# Patient Record
Sex: Female | Born: 1935 | Race: White | Hispanic: No | Marital: Married
Health system: Southern US, Community
[De-identification: ages and names within clinical notes are randomized; demographics above are authoritative.]

---

## 2002-08-22 ENCOUNTER — Encounter: Payer: Self-pay | Admitting: Internal Medicine

## 2002-08-22 ENCOUNTER — Ambulatory Visit (HOSPITAL_COMMUNITY): Admission: RE | Admit: 2002-08-22 | Discharge: 2002-08-22 | Payer: Self-pay | Admitting: Internal Medicine

## 2003-07-13 ENCOUNTER — Ambulatory Visit (HOSPITAL_COMMUNITY): Admission: RE | Admit: 2003-07-13 | Discharge: 2003-07-13 | Payer: Self-pay | Admitting: Internal Medicine

## 2004-08-29 ENCOUNTER — Ambulatory Visit (HOSPITAL_COMMUNITY): Admission: RE | Admit: 2004-08-29 | Discharge: 2004-08-29 | Payer: Self-pay | Admitting: Internal Medicine

## 2005-01-09 ENCOUNTER — Ambulatory Visit (HOSPITAL_COMMUNITY): Admission: RE | Admit: 2005-01-09 | Discharge: 2005-01-09 | Payer: Self-pay | Admitting: Internal Medicine

## 2006-01-11 ENCOUNTER — Ambulatory Visit (HOSPITAL_COMMUNITY): Admission: RE | Admit: 2006-01-11 | Discharge: 2006-01-11 | Payer: Self-pay | Admitting: Internal Medicine

## 2007-07-23 ENCOUNTER — Ambulatory Visit: Payer: Self-pay | Admitting: Internal Medicine

## 2007-08-05 ENCOUNTER — Encounter: Payer: Self-pay | Admitting: Internal Medicine

## 2007-08-05 ENCOUNTER — Ambulatory Visit (HOSPITAL_COMMUNITY): Admission: RE | Admit: 2007-08-05 | Discharge: 2007-08-05 | Payer: Self-pay | Admitting: Internal Medicine

## 2007-08-05 ENCOUNTER — Ambulatory Visit: Payer: Self-pay | Admitting: Internal Medicine

## 2007-09-17 ENCOUNTER — Ambulatory Visit (HOSPITAL_COMMUNITY): Admission: RE | Admit: 2007-09-17 | Discharge: 2007-09-17 | Payer: Self-pay | Admitting: Internal Medicine

## 2010-05-17 NOTE — H&P (Signed)
NAMEDREA, Marie Mccarthy                 ACCOUNT NO.:  [id number]   MEDICAL RECORD NO.:  [id number]          PATIENT TYPE:  AMB   LOCATION:  DAY                           FACILITY:  [facility]   PHYSICIAN:  R. Roetta Sessions, M.D. DATE OF BIRTH:  November 29, 1931   DATE OF ADMISSION:  DATE OF DISCHARGE:  LH                              HISTORY & PHYSICAL   REASON FOR CONSULTATION:  Abdominal pain, weight loss, altered bowel  habits.   HISTORY OF PRESENT ILLNESS:  Marie Mccarthy is a pleasant 77 year old  Caucasian female from [facility], sent over courtesy to Dr. Carylon Perches to further evaluate chronic GI symptoms including alternating  constipation, diarrhea, abdominal discomfort, and fleeting abdominal  knots.   Marie Mccarthy tells me that she worries about everything all the time and  she all of her life has had episodes of diarrhea alternating with  constipation.  Recently, she lost 20 pounds, but has gained some of that  weight back.  She tells me when she gets constipated, she can feel knots  in her abdominal wall, and she is describing the areas of the right  lower quadrant, left lower quadrant, and left upper quadrant.  When she  has a bowel movement, these knots are no longer perceptible.  She has  never passed any blood per rectum.  She does not have any nausea or  vomiting.  No odynophagia, dysphagia, early satiety, or reflux symptoms.  There is no family history of GI neoplasia.  She has no prior history of  gastrointestinal illness.  She has never had a lower GI tract evaluated  via colonoscopy or other modality per her report.   She sleeps poorly at night.  She may have 3-4 loose bowel movements on a  given day and at other times hardly has any bowel movement in a given  day.  She does not take anything specific for bowel function.   MEDICATION:  Her medications include Klonopin and nortriptyline.   PAST MEDICAL HISTORY:  Chronic anxiety neurosis for which she has been  on multiple  medications over time.   PAST SURGERIES:  Appendicitis and tonsillectomy.   CURRENT MEDICATIONS:  Klonopin 0.1 mg one half to one tablet b.i.d.  p.r.n. and nortriptyline 20 mg q.h.s.   ALLERGIES:  LEXAPRO.   FAMILY HISTORY:  Negative for GI or liver illness.  Mother succumbed to  pneumonia at age 52,  father succumbed to heart problems at age 85.  Otherwise, no history of chronic GI or liver illness.   SOCIAL HISTORY:  The patient is married, has two children.  [place]  She does not work.  No tobacco, no  alcohol, and no illicit drugs.   REVIEW OF SYSTEMS:  No chest pain or dyspnea on exertion.  No fever or  chills.   PHYSICAL EXAMINATION:  GENERAL:  Pleasant 77 year old lady, well  orientated, and comfortable.  VITALS SIGNS:  Weight 99, height 5 feet, temperature 97.7, BP 128/78,  and pulse 88.  SKIN:  Warm and dry.  There is no jaundice  or cutaneous stigmata of  chronic liver disease.  HEENT:  No scleral icterus.  Conjunctivae pink.  Oral cavity no lesions.  CHEST:  Lungs are clear to auscultation.  CARDIAC:  Regular rate and rhythm without murmur, gallop, or rub.  ABDOMEN:  Nondistended.  Positive bowel sounds.  No bruits.  Abdomen is  entirely soft, nontender without appreciable mass or hepatosplenomegaly.  EXTREMITIES:  No edema.  RECTAL:  Good sphincter tone.  Soft brown stool in the proximal rectal  vault.  No mass.  Brown stools.  Hemoccult negative.   LABORATORY DATA:  From Dr. Alonza Smoker office on May 10, 2007.  CBC; white  count 7.7, H&H 14.0 and 44.2.  Chem-20 looked entirely normal except for  slightly elevated blood sugar at 107.  Lipid profile; cholesterol 244,  HDL 73, LDL 143, and TSH 1.628.   IMPRESSION:  Marie Mccarthy is a very pleasant 76 year old lady with  alternating constipation and diarrhea along with fleeting abdominal wall  knots referred for further evaluation.   By history, she has had a significant recent fairly  acute drop in weight  from which she has recovered some weight.  I do get a sense that she  does have quite a bit of free-floating anxiety and spends a lot of time  perseverating on her GI symptoms.  I did not appreciate any mass or a  knot on abdominal exam today and it certainly is conceivable in the  setting of constipation and a relatively big body habitus, she may  actually be able to feel loops of colon containing stool as she spends a  lot of time self examining her abdomen.  The fact that these are not  knots diminish with having a bowel movement are reassuring.   This lady has never had a colonoscopy.   RECOMMENDATIONS:  Before we do anything else, I told Ms. Marie Mccarthy that we  would proceed on with a colonoscopy in the near future and then we could  deal with her bowel symptoms further.  The risks, benefits,  alternatives, and limitations of this approach have been reviewed.  Her  questions answered.  She is agreeable.  We will plan for colonoscopy in  the very near future, and we will make further recommendations with this  nice lady's management.      Jonathon Bellows, M.D.  Electronically Signed     RMR/MEDQ  D:  07/23/2007  T:  07/24/2007  Job:  [id number]   cc:   Kingsley Callander. Ouida Sills, MD  Fax: [phone]

## 2010-05-17 NOTE — Op Note (Signed)
NAMEBRYTON, Marie Mccarthy                 ACCOUNT NO.:  [id number]   MEDICAL RECORD NO.:  [id number]          PATIENT TYPE:  AMB   LOCATION:  DAY                           FACILITY:  [facility]   PHYSICIAN:  R. Roetta Sessions, M.D. DATE OF BIRTH:  [birth year]   DATE OF PROCEDURE:  08/05/2007  DATE OF DISCHARGE:                               OPERATIVE REPORT   INDICATIONS FOR PROCEDURE:  A 77 year old lady with intermittent  alternating constipation and diarrhea with the patient on observation,  she can feel knots in her colon when she is constipated, and after she  has evacuated, these abnormalities are no longer perceivable.  She has  never had her lower GI tract evaluated.  There is no family history of  colorectal neoplasia.  Colonoscopy is now being done.  Risks, benefits,  alternatives, and limitations have been reviewed, questions answered.  Please see the documentation in the medical record.   PROCEDURE NOTE:  O2 saturation, blood pressure, pulse, and respirations  were monitored throughout the entire procedure.   CONSCIOUS SEDATION:  Versed and Demerol on incremental doses.   INSTRUMENT:  Pentax video chip system.   FINDINGS:  Digital rectal exam revealed no abnormalities.  Endoscopic  findings:  The prep was adequate.  Colon:  Colonic mucosa was surveyed  from the rectosigmoid junction through the left transverse, right colon,  appendiceal orifice, ileocecal valve, and cecum.  These structures were  well seen and photographed for the record.  From this level, scope was  slowly withdrawn.  All previously mentioned mucosal surfaces were again  seen.  The patient had extensive left-sided and transverse diverticula.  There was an 8-mm polyp in the mid-descending colon, which was hot  snared, recovered.  The remainder of the colonic mucosa appeared  unremarkable.  The scope was pulled down to the rectum with examination  of rectal mucosa, including retroflexed view of the anal verge,  demonstrated no abnormalities.  The patient tolerated the procedure well  and was reacted to endoscopy.   IMPRESSION:  Normal rectum, extensive left-sided transverse diverticula  and descending colon polyp, status post hot snare removal.  Remainder of  the colonic mucosa appeared normal.   RECOMMENDATIONS:  1. Diverticulosis/constipation.  Literature was provided to Ms.      Yetta Flock.  2. She is to begin a daily fiber supplement with Benefiber and      Citrucel.  3. MiraLax 17 gm orally at bedtime p.r.n. constipation.  Follow up on      path.  4. Further recommendations to follow.      Jonathon Bellows, M.D.  Electronically Signed     RMR/MEDQ  D:  08/05/2007  T:  08/05/2007  Job:  [id number]   cc:   Kingsley Callander. Ouida Sills, MD  Fax: [phone]

## 2013-05-23 ENCOUNTER — Inpatient Hospital Stay: Payer: Self-pay | Admitting: Internal Medicine

## 2013-05-23 LAB — URINALYSIS, COMPLETE
Bilirubin,UR: NEGATIVE
Blood: NEGATIVE
GLUCOSE, UR: NEGATIVE mg/dL (ref 0–75)
NITRITE: NEGATIVE
PH: 5 (ref 4.5–8.0)
PROTEIN: NEGATIVE
RBC,UR: 4 /HPF (ref 0–5)
Specific Gravity: 1.023 (ref 1.003–1.030)
WBC UR: 19 /HPF (ref 0–5)

## 2013-05-23 LAB — CBC
HCT: 32 % — AB (ref 35.0–47.0)
HGB: 10.2 g/dL — AB (ref 12.0–16.0)
MCH: 28.9 pg (ref 26.0–34.0)
MCHC: 31.8 g/dL — ABNORMAL LOW (ref 32.0–36.0)
MCV: 91 fL (ref 80–100)
Platelet: 432 10*3/uL (ref 150–440)
RBC: 3.53 10*6/uL — ABNORMAL LOW (ref 3.80–5.20)
RDW: 13.9 % (ref 11.5–14.5)
WBC: 15.8 10*3/uL — ABNORMAL HIGH (ref 3.6–11.0)

## 2013-05-23 LAB — COMPREHENSIVE METABOLIC PANEL
ALBUMIN: 3 g/dL — AB (ref 3.4–5.0)
ALT: 16 U/L (ref 12–78)
Alkaline Phosphatase: 67 U/L
Anion Gap: 8 (ref 7–16)
BILIRUBIN TOTAL: 0.3 mg/dL (ref 0.2–1.0)
BUN: 30 mg/dL — ABNORMAL HIGH (ref 7–18)
Calcium, Total: 9.1 mg/dL (ref 8.5–10.1)
Chloride: 100 mmol/L (ref 98–107)
Co2: 29 mmol/L (ref 21–32)
Creatinine: 1.49 mg/dL — ABNORMAL HIGH (ref 0.60–1.30)
EGFR (African American): 38 — ABNORMAL LOW
GFR CALC NON AF AMER: 33 — AB
GLUCOSE: 119 mg/dL — AB (ref 65–99)
Osmolality: 281 (ref 275–301)
POTASSIUM: 4 mmol/L (ref 3.5–5.1)
SGOT(AST): 19 U/L (ref 15–37)
SODIUM: 137 mmol/L (ref 136–145)
TOTAL PROTEIN: 7.1 g/dL (ref 6.4–8.2)

## 2013-05-23 LAB — VALPROIC ACID LEVEL: VALPROIC ACID: 49 ug/mL — AB

## 2013-05-23 LAB — CK TOTAL AND CKMB (NOT AT ARMC)
CK, TOTAL: 146 U/L
CK-MB: 2.1 ng/mL (ref 0.5–3.6)

## 2013-05-23 LAB — TROPONIN I: Troponin-I: <0.02 ng/mL

## 2013-05-25 LAB — CBC WITH DIFFERENTIAL/PLATELET
BASOS ABS: 0.1 10*3/uL (ref 0.0–0.1)
Basophil %: 1.1 %
EOS ABS: 0.1 10*3/uL (ref 0.0–0.7)
EOS PCT: 1.1 %
HCT: 26.8 % — ABNORMAL LOW (ref 35.0–47.0)
HGB: 8.8 g/dL — ABNORMAL LOW (ref 12.0–16.0)
LYMPHS ABS: 1.6 10*3/uL (ref 1.0–3.6)
Lymphocyte %: 19.8 %
MCH: 29.7 pg (ref 26.0–34.0)
MCHC: 32.9 g/dL (ref 32.0–36.0)
MCV: 90 fL (ref 80–100)
Monocyte #: 0.9 x10 3/mm (ref 0.2–0.9)
Monocyte %: 11.2 %
NEUTROS PCT: 66.8 %
Neutrophil #: 5.2 10*3/uL (ref 1.4–6.5)
Platelet: 324 10*3/uL (ref 150–440)
RBC: 2.97 10*6/uL — AB (ref 3.80–5.20)
RDW: 13.5 % (ref 11.5–14.5)
WBC: 7.8 10*3/uL (ref 3.6–11.0)

## 2013-05-25 LAB — BASIC METABOLIC PANEL
ANION GAP: 1 — AB (ref 7–16)
BUN: 19 mg/dL — AB (ref 7–18)
CALCIUM: 8.3 mg/dL — AB (ref 8.5–10.1)
CHLORIDE: 108 mmol/L — AB (ref 98–107)
CO2: 26 mmol/L (ref 21–32)
Creatinine: 0.91 mg/dL (ref 0.60–1.30)
EGFR (African American): >60
EGFR (Non-African Amer.): >60
GLUCOSE: 88 mg/dL (ref 65–99)
Osmolality: 272 (ref 275–301)
POTASSIUM: 3.6 mmol/L (ref 3.5–5.1)
SODIUM: 135 mmol/L — AB (ref 136–145)

## 2013-05-25 LAB — URINE CULTURE

## 2013-05-27 LAB — PLATELET COUNT: Platelet: 337 10*3/uL (ref 150–440)

## 2013-06-15 ENCOUNTER — Emergency Department: Payer: Self-pay | Admitting: Emergency Medicine

## 2013-06-15 LAB — CBC
HCT: 27.4 % — ABNORMAL LOW (ref 35.0–47.0)
HGB: 9.1 g/dL — ABNORMAL LOW (ref 12.0–16.0)
MCH: 30.3 pg (ref 26.0–34.0)
MCHC: 33.3 g/dL (ref 32.0–36.0)
MCV: 91 fL (ref 80–100)
PLATELETS: 426 10*3/uL (ref 150–440)
RBC: 3.01 10*6/uL — AB (ref 3.80–5.20)
RDW: 14.1 % (ref 11.5–14.5)
WBC: 8.8 10*3/uL (ref 3.6–11.0)

## 2013-06-15 LAB — URINALYSIS, COMPLETE
BILIRUBIN, UR: NEGATIVE
Bacteria: NONE SEEN
Blood: NEGATIVE
Glucose,UR: NEGATIVE mg/dL (ref 0–75)
Leukocyte Esterase: NEGATIVE
Nitrite: NEGATIVE
PROTEIN: NEGATIVE
Ph: 6 (ref 4.5–8.0)
SPECIFIC GRAVITY: 1.01 (ref 1.003–1.030)
Squamous Epithelial: <1

## 2013-06-15 LAB — COMPREHENSIVE METABOLIC PANEL
ALBUMIN: 3.1 g/dL — AB (ref 3.4–5.0)
Alkaline Phosphatase: 67 U/L
Anion Gap: 4 — ABNORMAL LOW (ref 7–16)
BILIRUBIN TOTAL: 0.2 mg/dL (ref 0.2–1.0)
BUN: 23 mg/dL — AB (ref 7–18)
CALCIUM: 9 mg/dL (ref 8.5–10.1)
CHLORIDE: 99 mmol/L (ref 98–107)
Co2: 30 mmol/L (ref 21–32)
Creatinine: 1.22 mg/dL (ref 0.60–1.30)
EGFR (African American): 49 — ABNORMAL LOW
EGFR (Non-African Amer.): 42 — ABNORMAL LOW
Glucose: 91 mg/dL (ref 65–99)
Osmolality: 270 (ref 275–301)
Potassium: 4.7 mmol/L (ref 3.5–5.1)
SGOT(AST): 18 U/L (ref 15–37)
SGPT (ALT): 12 U/L (ref 12–78)
Sodium: 133 mmol/L — ABNORMAL LOW (ref 136–145)
TOTAL PROTEIN: 7 g/dL (ref 6.4–8.2)

## 2013-06-15 LAB — VALPROIC ACID LEVEL: Valproic Acid: 58 ug/mL

## 2013-06-15 LAB — TROPONIN I: Troponin-I: <0.02 ng/mL

## 2013-06-16 LAB — CBC
HCT: 28.8 % — AB (ref 35.0–47.0)
HGB: 9.7 g/dL — ABNORMAL LOW (ref 12.0–16.0)
MCH: 30.3 pg (ref 26.0–34.0)
MCHC: 33.6 g/dL (ref 32.0–36.0)
MCV: 90 fL (ref 80–100)
PLATELETS: 455 10*3/uL — AB (ref 150–440)
RBC: 3.2 10*6/uL — AB (ref 3.80–5.20)
RDW: 14.3 % (ref 11.5–14.5)
WBC: 9 10*3/uL (ref 3.6–11.0)

## 2013-06-16 LAB — URINALYSIS, COMPLETE
BLOOD: NEGATIVE
Bilirubin,UR: NEGATIVE
Glucose,UR: NEGATIVE mg/dL (ref 0–75)
Nitrite: NEGATIVE
PH: 5 (ref 4.5–8.0)
PROTEIN: NEGATIVE
RBC,UR: 3 /HPF (ref 0–5)
Specific Gravity: 1.02 (ref 1.003–1.030)
WBC UR: 65 /HPF (ref 0–5)

## 2013-06-16 LAB — COMPREHENSIVE METABOLIC PANEL
ALBUMIN: 3 g/dL — AB (ref 3.4–5.0)
ALK PHOS: 70 U/L
ALT: 11 U/L — AB (ref 12–78)
ANION GAP: 4 — AB (ref 7–16)
AST: 14 U/L — AB (ref 15–37)
BUN: 21 mg/dL — AB (ref 7–18)
Bilirubin,Total: 0.2 mg/dL (ref 0.2–1.0)
CHLORIDE: 104 mmol/L (ref 98–107)
CO2: 28 mmol/L (ref 21–32)
CREATININE: 1.26 mg/dL (ref 0.60–1.30)
Calcium, Total: 9.1 mg/dL (ref 8.5–10.1)
EGFR (Non-African Amer.): 40 — ABNORMAL LOW
GFR CALC AF AMER: 47 — AB
GLUCOSE: 87 mg/dL (ref 65–99)
OSMOLALITY: 274 (ref 275–301)
Potassium: 4 mmol/L (ref 3.5–5.1)
Sodium: 136 mmol/L (ref 136–145)
TOTAL PROTEIN: 7 g/dL (ref 6.4–8.2)

## 2013-06-16 LAB — TROPONIN I

## 2013-06-17 ENCOUNTER — Inpatient Hospital Stay: Payer: Self-pay | Admitting: Internal Medicine

## 2013-06-17 LAB — VALPROIC ACID LEVEL: Valproic Acid: 23 ug/mL — ABNORMAL LOW

## 2013-06-18 LAB — BASIC METABOLIC PANEL
ANION GAP: 7 (ref 7–16)
BUN: 11 mg/dL (ref 7–18)
CHLORIDE: 106 mmol/L (ref 98–107)
CREATININE: 0.91 mg/dL (ref 0.60–1.30)
Calcium, Total: 8.3 mg/dL — ABNORMAL LOW (ref 8.5–10.1)
Co2: 26 mmol/L (ref 21–32)
EGFR (Non-African Amer.): >60
Glucose: 81 mg/dL (ref 65–99)
OSMOLALITY: 276 (ref 275–301)
Potassium: 3.3 mmol/L — ABNORMAL LOW (ref 3.5–5.1)
Sodium: 139 mmol/L (ref 136–145)

## 2013-06-20 LAB — URINE CULTURE

## 2013-06-21 LAB — CULTURE, BLOOD (SINGLE)

## 2013-06-22 LAB — CULTURE, BLOOD (SINGLE)

## 2013-08-23 ENCOUNTER — Emergency Department: Payer: Self-pay | Admitting: Internal Medicine

## 2013-09-02 ENCOUNTER — Emergency Department: Payer: Self-pay | Admitting: Emergency Medicine

## 2013-09-02 LAB — CBC WITH DIFFERENTIAL/PLATELET
BASOS ABS: 0.1 10*3/uL (ref 0.0–0.1)
Basophil %: 0.9 %
EOS ABS: 0.1 10*3/uL (ref 0.0–0.7)
Eosinophil %: 0.6 %
HCT: 36.9 % (ref 35.0–47.0)
HGB: 11.8 g/dL — ABNORMAL LOW (ref 12.0–16.0)
LYMPHS ABS: 1.7 10*3/uL (ref 1.0–3.6)
LYMPHS PCT: 15.6 %
MCH: 27.3 pg (ref 26.0–34.0)
MCHC: 31.9 g/dL — AB (ref 32.0–36.0)
MCV: 86 fL (ref 80–100)
MONO ABS: 0.8 x10 3/mm (ref 0.2–0.9)
MONOS PCT: 7.4 %
NEUTROS PCT: 75.5 %
Neutrophil #: 8.1 10*3/uL — ABNORMAL HIGH (ref 1.4–6.5)
Platelet: 356 10*3/uL (ref 150–440)
RBC: 4.31 10*6/uL (ref 3.80–5.20)
RDW: 14.9 % — ABNORMAL HIGH (ref 11.5–14.5)
WBC: 10.7 10*3/uL (ref 3.6–11.0)

## 2013-09-02 LAB — COMPREHENSIVE METABOLIC PANEL
ALK PHOS: 70 U/L
ALT: 15 U/L
ANION GAP: 6 — AB (ref 7–16)
AST: 17 U/L (ref 15–37)
Albumin: 3.1 g/dL — ABNORMAL LOW (ref 3.4–5.0)
BILIRUBIN TOTAL: 0.2 mg/dL (ref 0.2–1.0)
BUN: 25 mg/dL — AB (ref 7–18)
CALCIUM: 8.8 mg/dL (ref 8.5–10.1)
CHLORIDE: 106 mmol/L (ref 98–107)
CO2: 29 mmol/L (ref 21–32)
CREATININE: 0.89 mg/dL (ref 0.60–1.30)
EGFR (African American): >60
EGFR (Non-African Amer.): >60
GLUCOSE: 105 mg/dL — AB (ref 65–99)
OSMOLALITY: 286 (ref 275–301)
POTASSIUM: 3.1 mmol/L — AB (ref 3.5–5.1)
Sodium: 141 mmol/L (ref 136–145)
TOTAL PROTEIN: 7.1 g/dL (ref 6.4–8.2)

## 2013-11-02 DEATH — deceased

## 2014-04-25 NOTE — Discharge Summary (Signed)
PATIENT NAME:  Marie Mccarthy, [name] MR#:  [id number] DATE OF BIRTH:  05/05/1931  DATE OF ADMISSION:  06/17/2013 DATE OF DISCHARGE:  06/18/2013   ADMISSION DIAGNOSES:   1.  Altered mental status/encephalopathy.  2.  Urinary tract infection.  3.  Advanced dementia.   DISCHARGE DIAGNOSES: 1.  Encephalopathy/altered mental status, possibly due to urinary tract infection on top of advanced dementia.  2.  Enterococcus urinary tract infection with only 15,000 colonies.  3.  Advanced dementia.  4.  Dehydration.  5.  Hypertension.   CONSULTATIONS: None.   LABORATORY DATA: Sodium 139, potassium 3.3, chloride 106, bicarbonate 26, BUN 11, creatinine 0.91, glucose 81.   Urine culture: 15,000 enterococcus.   HOSPITAL COURSE:  This is a 81 year old female with advanced dementia who presented with encephalopathy/altered mental status. For further details, please refer to H and P. 1.  Altered mental status on top of advanced dementia, multifactorial, secondary to dehydration, possible very minimal urinary tract infection, and her advanced dementia as well as possible  medication-induced. The patient actually did well with IV fluids and some antibiotics. She seems to be back to her baseline. Her Depakote level was within normal limits.  2.  Enterococcus UTI. The patient only has 15,000 colonies, so likely not all due to urinary tract infection. She was placed on empiric antibiotics. 3.  Dehydration. The patient's renal function has improved with IV fluids.  4.  Advanced dementia. The patient will go back to her facility and continue her outpatient medications, but we did stop Remeron due to problem #1.   DISCHARGE MEDICATIONS: 1.  Norvasc 10 mg daily.  2.  Depakote 125 mg 2 tablets b.i.d.  3.  Enalapril 20 mg daily.  4.  Risperdal 0.5 mg b.i.d.  5.  Tussin 10 mL q.6 hours p.r.n.  6.  Tylenol 500 mg q.4 hours p.r.n. pain, temperature.  7.  MOM 30 mL at bedtime p.r.n.  8.  Aspirin 81 mg daily.  9.  Donepezil  10 mg daily.  10.  Triple antibiotic to affected area daily.  11.  Macrobid 100 mg b.i.d. x 7 days.  12.  Amoxicillin 500 mg t.i.d. x 5 days.   DISCHARGE REFERRALS: Home health with physical therapy and nurse.   DISCHARGE DIET: Regular diet.   DISCHARGE FOLLOWUP: The patient can follow up with her primary doctor in one week. The patient is medically stable for discharge.   TIME SPENT: 35 minutes. Plan of care was discussed with the daughter.   ____________________________ Janyth ContesSital P. Juliene PinaMody, MD spm:jcm D: 06/18/2013 12:56:06 ET T: 06/18/2013 13:22:43 ET JOB#: [id number]  cc: Sital P. Juliene PinaMody, MD, <Dictator> Janyth ContesSITAL P MODY MD ELECTRONICALLY SIGNED 06/18/2013 13:51

## 2014-04-25 NOTE — H&P (Signed)
PATIENT NAME:  Marie Mccarthy, Marie Mccarthy MR#:  [id number] DATE OF BIRTH:  Feb 09, 1931  DATE OF ADMISSION:  06/17/2013  PRIMARY CARE PHYSICIAN: The patient is from [facility] .  REFERRING PHYSICIAN: Dr. Scotty Court   CHIEF COMPLAINT: Altered mental status.   HISTORY OF PRESENT ILLNESS: This patient is a 81 year old female with advanced dementia who is brought to the Emergency Department from [facility] after patient is found to be altered mental status, shaking. The patient at baseline communicates even though illogically. The patient had similar condition last night and the patient was brought to the Emergency Department, was given fluids with some improvement, and was sent back to [facility]. This afternoon, the patient was found to be in similar condition of confusion, not talking. Workup in the Emergency Department, patient is found to have urinary tract infection with 2+ leukocyte esterase and WBC of 65. The patient had a similar admission end of May and was discharged on 05/26/2013, at which time the patient was found to have urinary tract infection. Urine cultures were negative. The patient is on multiple sedative medications of risperidone, mirtazapine, Depakote. Per daughter, patient does not have any behavioral issues.   PAST MEDICAL HISTORY:  Patient is tachycardic with a heart rate of 120, temperature of 99.8.   PAST MEDICAL HISTORY:  1. Advanced dementia.  2. Hypertension.   ALLERGIES: No known drug allergies.   HOME MEDICATIONS:  1. 10 mL every 6 hours as needed.  2. Triple antibiotic to the affected area. 3. Risperidone 0.5 mg 2 times a day.  4. Mirtazapine 7.5 mg at bedtime.  5. Milk of magnesia once a day.  6. Tylenol every 4 hours as needed.  7. Maalox Advanced 2 tablespoons.  8. Loperamide 2 mg every 3 hours.  9. Enalapril 20 mg once daily.  10. Donepezil 10 mg once a day.  11. Depakote 250 mg 2 times a day.  12. Aspirin 81 mg daily.  13. Amlodipine 1 tablet once a day.    SOCIAL HISTORY: No history of smoking, drinking alcohol or using illicit drugs.   FAMILY HISTORY: Per records, positive for hypertension.   REVIEW OF SYSTEMS: Could not be obtained from the patient secondary to altered mental status.   PHYSICAL EXAMINATION: GENERAL: This is a well-built, well-nourished, age-appropriate female lying down in the bed, not in distress, quite somnolent.  VITAL SIGNS: Temperature 99.8, pulse 120, blood pressure 155/60, respiratory rate of 16, oxygen saturation is 98% on room air.  HEENT: Head normocephalic, atraumatic.  sclerae. Conjunctivae normal. Pupils equal and reactive. Extraocular movements, could not examine. The mucous membranes are dry.  Could not examine the oropharynx.  NECK: Supple. No lymphadenopathy. No JVD. No carotid bruit. No thyromegaly.  CHEST: Has focal tenderness on the right breast area and no rash or lesions are noted.  LUNGS: Bilaterally clear to auscultation.  HEART: S1, S2 regular. No murmurs are heard.  ABDOMEN: Bowel sounds present. Soft, nontender, nondistended. No hepatosplenomegaly.  EXTREMITIES: No pedal edema. Pulses 2+.  NEUROLOGIC: The patient is patient is not oriented to place, person, and time. No apparent cranial nerve abnormalities. Could not examine the motor and sensory.   LABORATORY DATA: UA 2+ leukocyte esterase, WBC of 65. Complete metabolic panel is completely within normal limits. Troponin less than 0.02.   ASSESSMENT AND PLAN: This patient is a 81 year old female who comes to the Emergency Department with altered mental status.  1. Altered mental status. This could be a combination of factors: medication, dehydration, and  possibly urinary tract infection. We will obtain urine cultures. Keep the patient on Rocephin. Continue with IV fluids. Hold all sedative medications.  2. Urinary tract infection. The patient had a similar admission during previous visit.  Urine cultures were negative.  Repeat the urine cultures.  Continue with Rocephin and followup.  3. Hypertension. Currently well controlled.  4. Dementia, advanced.  At baseline, the patient does not recognize her family members. Discussed with the daughter about the poor prognosis and its gradual progression. The patient's daughter expressed understanding.  5. Keep the patient on deep vein thrombosis prophylaxis with Lovenox.   TIME SPENT: 55 minutes.    ____________________________ Susa GriffinsPadmaja Lavelle Akel, MD pv:dd D: 06/17/2013 00:42:46 ET T: 06/17/2013 01:56:37 ET JOB#: [id number]  cc: Susa GriffinsPadmaja Morrill Bomkamp, MD, <Dictator> Susa GriffinsPADMAJA Asani Mcburney MD ELECTRONICALLY SIGNED 06/18/2013 7:30

## 2014-04-25 NOTE — H&P (Signed)
PATIENT NAME:  Marie Mccarthy, Marie MR#:  [id number] DATE OF BIRTH:  [birth year]  DATE OF ADMISSION:  05/23/2013  PRIMARY CARE PHYSICIAN: Dr. Clovis CaoShenif Ladak ([facility]  CHIEF COMPLAINT: Decreased mentation, lethargy, poor p.o. intake.   HISTORY OF PRESENT ILLNESS: Ms. Marie Mccarthy is a 81 year old Caucasian female with history of hypertension and dementia, who is a resident of [facility], comes to the Emergency Room with increasing lethargy, weakness, and poor p.o. intake. Per daughter, who is present in the Emergency Room who gives most of the history since the patient has a poor historian and nonverbal at this time, except for a few words due to her dementia, tells me the patient has been declining since Mother's Day. She has had eventually poor p.o. intake, has been having diarrhea, unknown quantity and time at [facility]. No fever noted. The patient awakens on verbal commands. She is hemodynamically stable, although clinically looks dehydrated. She is being admitted for clinical dehydration, UTI, and altered mental status/encephalopathy.   PAST MEDICAL HISTORY: 1.  Dementia.  2.  Hypertension.   MEDICATIONS:  1.  Tussin 10 mg every 6 hours as needed.  2.  Risperidone 0.5 mg 1 tablet b.i.d.  3.  Remeron 7.5 mg once a day at bedtime.  4.  Milk of magnesia 30 mL once a day at bedtime as needed.  5.  Mapap 500 mg 1 tablet every 4 hours as needed.  6.  Maalox 30 mL 4 times a day as needed.  7.  Loperamide 2 mg orally with each stool as needed.  8.  Enalapril 20 mg daily.  9.  Donepezil 10 mg once a day at bedtime.  10.  Depakote 125 mg 2 capsules b.i.d.  11.  Baclofen 10 mg b.i.d.  12.  Aspirin 81 mg daily.  13.  Amlodipine 10 mg daily.   REVIEW OF SYSTEMS: Unobtainable secondary to severe advanced dementia.   FAMILY HISTORY: Positive for hypertension.   SOCIAL HISTORY: [facility] a [facility]. Nonsmoker, nonalcoholic.   PHYSICAL  EXAMINATION: GENERAL: The patient awakens on verbal commands, does not communicate much, says yes and no. VITAL SIGNS: Afebrile, pulse 83, blood pressure 122/42, sats 97% on room air.  HEENT: Atraumatic, normocephalic. Poor hygiene.  NECK: Supple. No JVD. No carotid bruit.  LUNGS: Clear to auscultation bilaterally. No rales, rhonchi, respiratory distress or labored breathing.  HEART: Both the heart sounds are normal. Rate, rhythm regular. PMI not lateralized. Chest is nontender.  EXTREMITIES: Good pedal pulses, good femoral pulses.  No lower extremity edema.  ABDOMEN: Soft, benign, nontender. No organomegaly.  SKIN: Warm and dry.  PSYCHIATRIC: The patient is awake however does not verbalize much suspected due to her dementia.  DIAGNOSTIC DATA: Creatinine 1.49, sodium 137, BUN 30, glucose 119. LFTs within normal limits.   Chest x-ray: No acute abnormalities. Hemoglobin and hematocrit is 10.2 and 32. Platelet count is 432,000. White count is 15.8. Cardiac enzymes negative. Urinalysis is positive for UTI.   CT of cervical spine shows no acute cervical spine fracture or dislocation. Mild degenerative change present.   CT head shows chronic progressive diffuse atrophy with ventriculomegaly. No acute ischemic or hemorrhagic infarction noted.   ASSESSMENT AND PLAN: This is 81 year old, Ms. Marie Mccarthy, with advanced dementia and hypertension who comes in with decreased level of consciousness. She is being admitted with:  1.  Altered mental status/mild encephalopathy suspected due to clinical dehydration in the setting of urinary tract infection. Will give  IV fluids. We will keep the patient n.p.o. for now. We will start feeding her once she is more awake and alert. IV Rocephin for urinary tract infection. Follow blood cultures, urine cultures.  2.  Acute renal failure with clinical dehydration. Continue IV fluids. Monitor ins and outs. Encourage p.o. liquids. 3.  Advanced dementia. The patient is on  donepezil along with Remeron and risperidone which we will continue. I am not sure why she is on Depakote, possibly as a mood stabilizer.  4.  Deep vein thrombosis prophylaxis. Subcutaneous heparin t.i.d.  5.  Physical therapy for gait ambulation and training.  6.  Care management for discharge planning.   Further work-up according to the patient's clinical course. Hospital admission plan was discussed with the patient's daughter who is agreeable to it. The patient is a no code, DNR. She carries the out of facility DNR form.  TIME SPENT: 50 minutes.   ____________________________ Wylie Hail Allena Katz, MD sap:sb D: 05/23/2013 16:31:52 ET T: 05/23/2013 17:00:16 ET JOB#: [id number]  cc: Zofia Peckinpaugh A. Allena Katz, MD, <Dictator> Willow Ora MD ELECTRONICALLY SIGNED 05/25/2013 16:26

## 2014-04-25 NOTE — Discharge Summary (Signed)
PATIENT NAME:  Marie Mccarthy, Marie Mccarthy MR#:  [id number] DATE OF BIRTH:  06/10/1931  DATE OF ADMISSION:  05/23/2013 DATE OF DISCHARGE:  05/27/2013   PRIMARY CARE PHYSICIAN: None local.   DISCHARGE DIAGNOSES: Metabolic encephalopathy, worsening dementia, acute renal failure/dehydration, urinary tract infection, hypertension.   CONDITION: Stable.   CODE STATUS: DNR.   HOME MEDICATIONS: Please refer to the medication reconciliation list.   DIET: Low-sodium diet, pureed nectar-thick liquids. Please refer to the diet instructions in detail in discharge instructions.   ACTIVITY: As tolerated.   FOLLOWUP CARE: Follow up with PCP within 1 to 2 weeks.   REASON FOR ADMISSION: Decreased mentation, lethargy, and poor oral intake.   HOSPITAL COURSE: The patient is a 81 year old Caucasian female with a history of hypertension and dementia. She is a resident of [facility]. Was sent to ED for increasing lethargy, weakness, and poor oral intake. The patient was admitted for dehydration, UTI, and altered mental status. For detailed history and physical examination, please refer to the admission note dictated by Dr. Enedina FinnerSona Patel. On admission date, the patient's chest x-ray did not show any infiltrate. CAT scan of the cervical spine did not show any cervical spine fracture. CAT scan of head showed chronic progressive diffuse atrophy with ventriculomegaly, no acute infarction. 1.  Altered mental status with  acute encephalopathy, possibly due to dehydration and UTI. After admission, the patient initially was kept n.p.o. with IV fluid support and Rocephin. Culture so far is negative. The patient's mental status is back to her baseline.  2.  Acute renal failure with Dehydration. After IV fluid support, improved.  3.  Leukocytosis, possibly due to UTI. WBC decreased from 15.8 to 7.8.  4.  Dementia. The patient has been treated with Risperdal. Remeron was discontinued.  5.  Hypertension, controlled.   The patient is  clinically stable and will be discharged to skilled nursing facility today. The patient is waiting for nursing home staff evaluation. I discussed the patient's discharge plan with the nurse, case manager, Child psychotherapistsocial worker.   TIME SPENT: About 39 minutes.   ____________________________ Shaune PollackQing Africa Masaki, MD qc:jcm D: 05/27/2013 16:18:03 ET T: 05/27/2013 17:15:41 ET JOB#: [id number]  cc: Shaune PollackQing Soul Hackman, MD, <Dictator> Shaune PollackQING Jamine Highfill MD ELECTRONICALLY SIGNED 05/28/2013 17:16

## 2015-07-23 IMAGING — CT CT HEAD WITHOUT CONTRAST
1 of 2 series · 13 of 30 positions shown, 17 images · non-contrast
Comparison: CT head without contrast 05/23/2013.

CLINICAL DATA: Altered mental status. Dementia. The patient well
only sit at the table with her head down and communicate pain.

EXAM:
CT HEAD WITHOUT CONTRAST
TECHNIQUE: Contiguous axial images were obtained from the base of the skull
through the vertex without intravenous contrast.

[Series 2: head wo · axial · 0.42mm/px · z∈[-54,+71]mm · 13 of 31 slices shown, 17 images]
[im 3/31  brain]
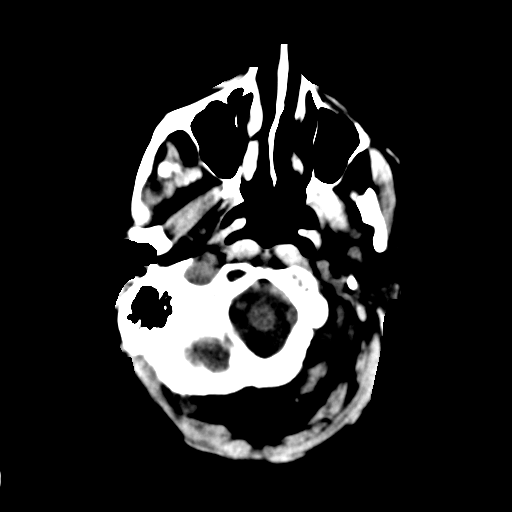
[im 3/31  bone]
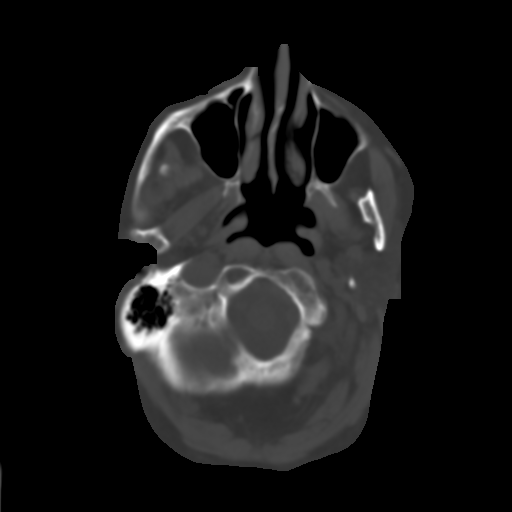
[im 5/31  brain]
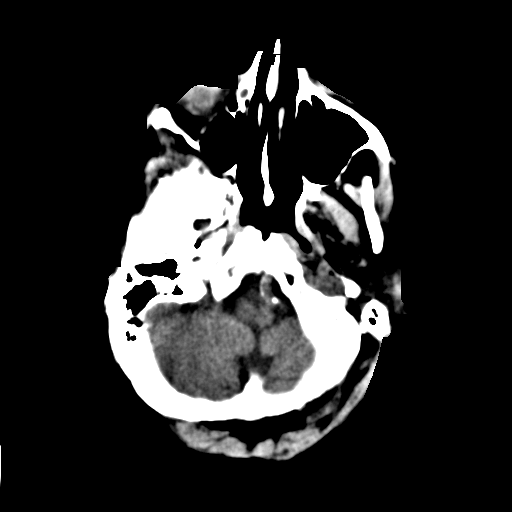
[im 7/31  brain]
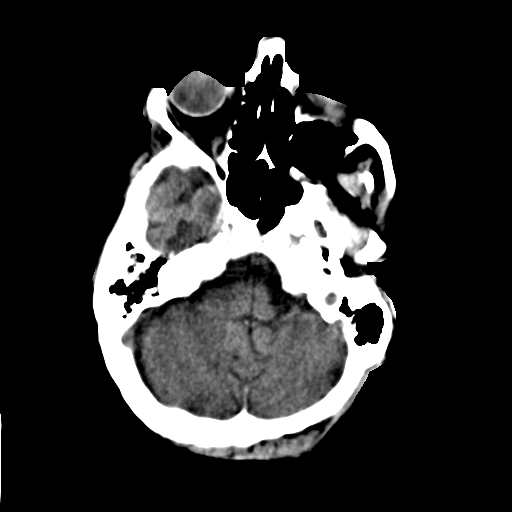
[im 9/31  brain]
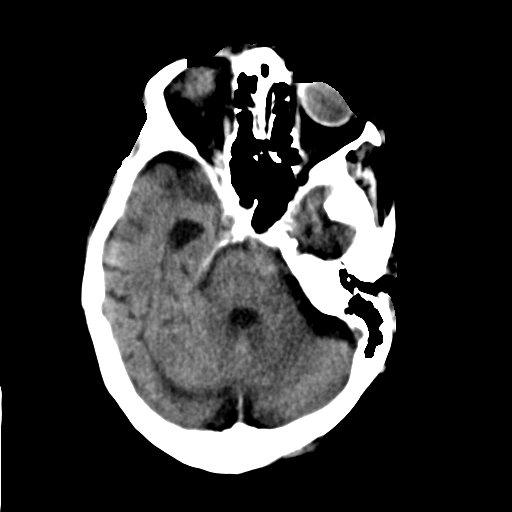
[im 11/31  brain]
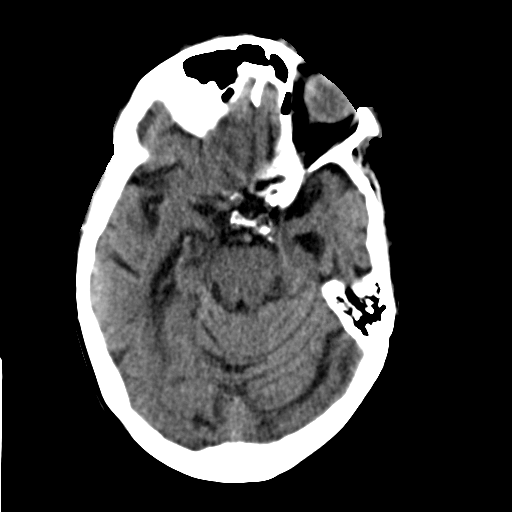
[im 11/31  bone]
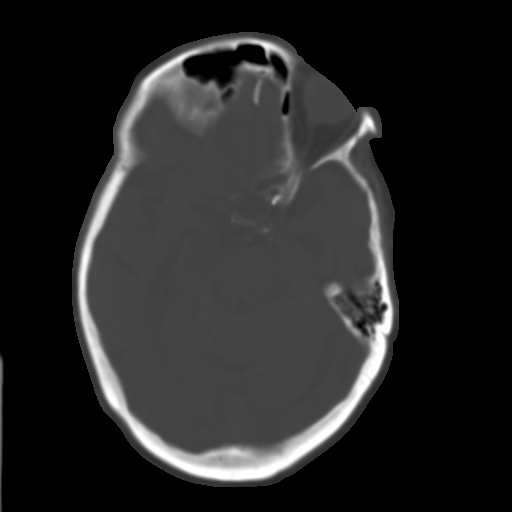
[im 13/31  brain]
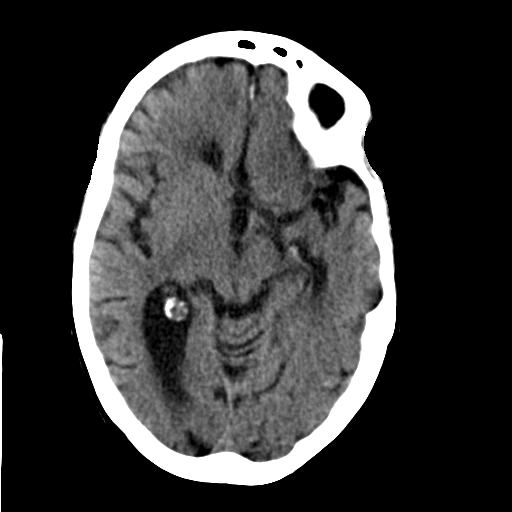
[im 16/31  brain]
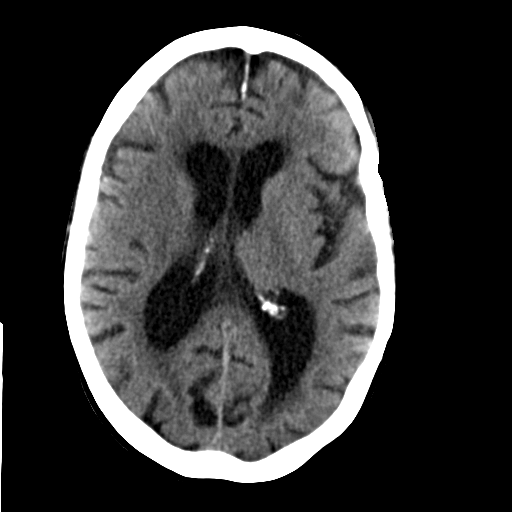
[im 18/31  brain]
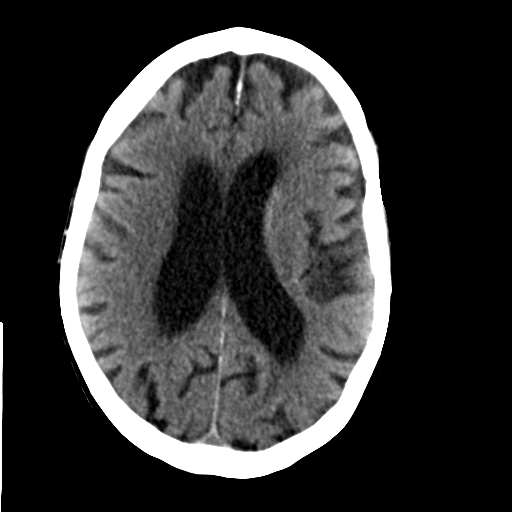
[im 20/31  brain]
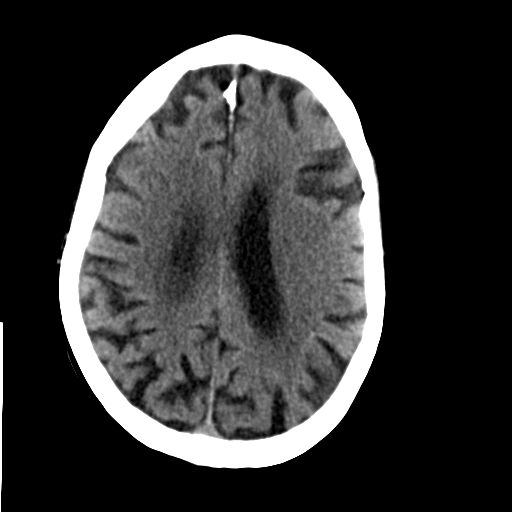
[im 20/31  bone]
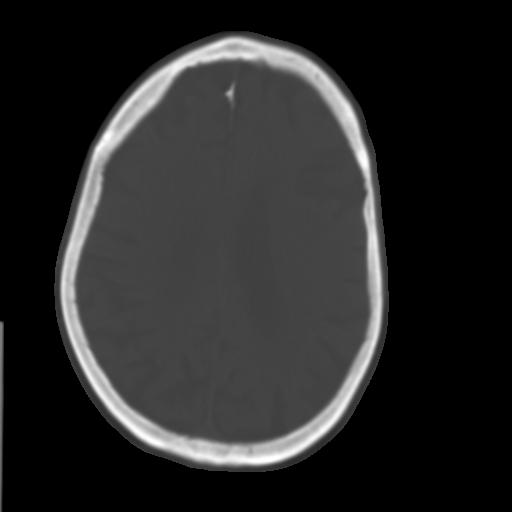
[im 22/31  brain]
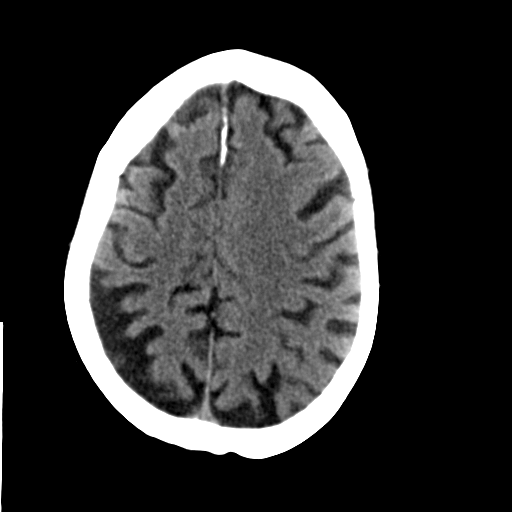
[im 24/31  brain]
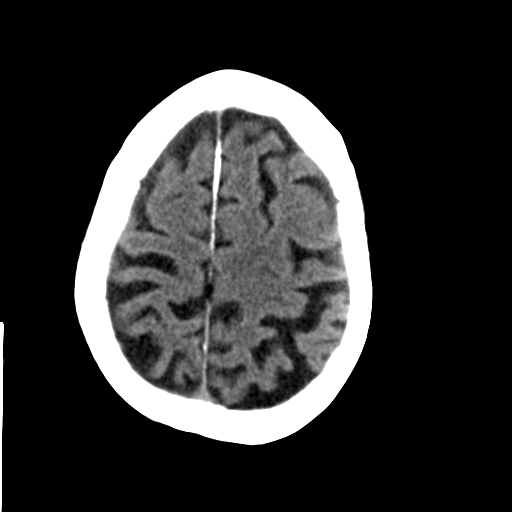
[im 26/31  brain]
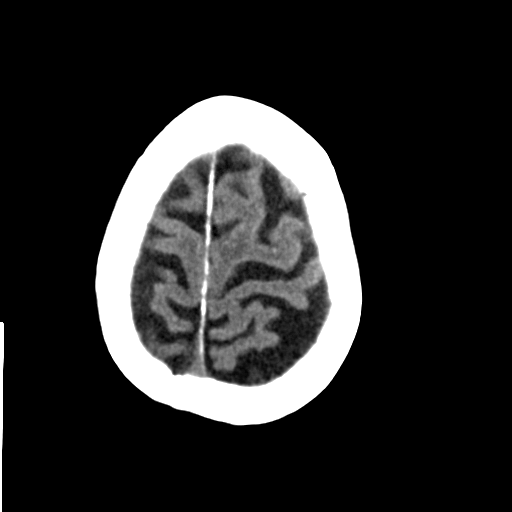
[im 28/31  brain]
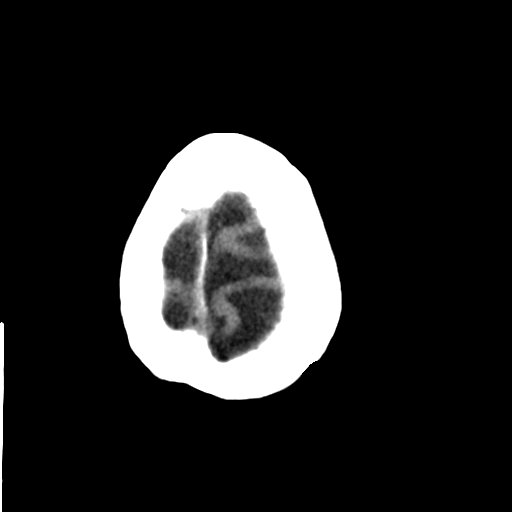
[im 28/31  bone]
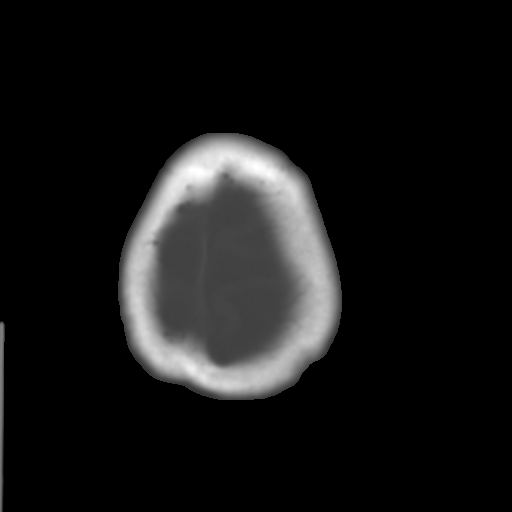

[13 of 30 positions shown; findings below may reference images not displayed]

FINDINGS: Moderate generalized atrophy and white matter disease is stable. No
acute cortical infarct, hemorrhage, or mass lesion is present.
Atherosclerotic calcifications within the cavernous carotid arteries
and at the dural margin of the vertebral arteries is stable. The
paranasal sinuses and mastoid air cells are clear. The osseous skull
is intact.
IMPRESSION: 1. No acute intracranial abnormality or significant interval change.
2. Stable atrophy and white matter disease.
